# Patient Record
Sex: Female | Born: 1980 | Race: Asian | Hispanic: No | Marital: Married | Smoking: Never smoker
Health system: Southern US, Community
[De-identification: ages and names within clinical notes are randomized; demographics above are authoritative.]

## PROBLEM LIST (undated history)

## (undated) DIAGNOSIS — IMO0002 Reserved for concepts with insufficient information to code with codable children: Secondary | ICD-10-CM

## (undated) DIAGNOSIS — J45909 Unspecified asthma, uncomplicated: Secondary | ICD-10-CM

## (undated) DIAGNOSIS — M329 Systemic lupus erythematosus, unspecified: Secondary | ICD-10-CM

## (undated) HISTORY — PX: EYE SURGERY: SHX253

---

## 2018-03-27 ENCOUNTER — Ambulatory Visit (INDEPENDENT_AMBULATORY_CARE_PROVIDER_SITE_OTHER): Payer: BLUE CROSS/BLUE SHIELD

## 2018-03-27 ENCOUNTER — Ambulatory Visit
Admission: EM | Admit: 2018-03-27 | Discharge: 2018-03-27 | Disposition: A | Discharge status: Home / Self Care | Payer: BLUE CROSS/BLUE SHIELD

## 2018-03-27 ENCOUNTER — Encounter: Payer: Self-pay | Admitting: Gynecology

## 2018-03-27 ENCOUNTER — Other Ambulatory Visit: Payer: Self-pay

## 2018-03-27 DIAGNOSIS — R109 Unspecified abdominal pain: Secondary | ICD-10-CM

## 2018-03-27 DIAGNOSIS — R112 Nausea with vomiting, unspecified: Secondary | ICD-10-CM

## 2018-03-27 DIAGNOSIS — R197 Diarrhea, unspecified: Secondary | ICD-10-CM | POA: Diagnosis not present

## 2018-03-27 DIAGNOSIS — I862 Pelvic varices: Secondary | ICD-10-CM

## 2018-03-27 HISTORY — DX: Reserved for concepts with insufficient information to code with codable children: IMO0002

## 2018-03-27 HISTORY — DX: Unspecified asthma, uncomplicated: J45.909

## 2018-03-27 HISTORY — DX: Systemic lupus erythematosus, unspecified: M32.9

## 2018-03-27 LAB — CBC WITH DIFFERENTIAL/PLATELET
Abs Immature Granulocytes: 0.02 10*3/uL (ref 0.00–0.07)
Basophils Absolute: 0 10*3/uL (ref 0.0–0.1)
Basophils Relative: 0 %
Eosinophils Absolute: 0 10*3/uL (ref 0.0–0.5)
Eosinophils Relative: 0 %
HCT: 41.4 % (ref 36.0–46.0)
Hemoglobin: 14.2 g/dL (ref 12.0–15.0)
Immature Granulocytes: 0 %
Lymphocytes Relative: 15 %
Lymphs Abs: 0.8 10*3/uL (ref 0.7–4.0)
MCH: 30.7 pg (ref 26.0–34.0)
MCHC: 34.3 g/dL (ref 30.0–36.0)
MCV: 89.6 fL (ref 80.0–100.0)
Monocytes Absolute: 0.3 10*3/uL (ref 0.1–1.0)
Monocytes Relative: 6 %
Neutro Abs: 4.3 10*3/uL (ref 1.7–7.7)
Neutrophils Relative %: 79 %
Platelets: 207 10*3/uL (ref 150–400)
RBC: 4.62 MIL/uL (ref 3.87–5.11)
RDW: 11.8 % (ref 11.5–15.5)
WBC: 5.5 10*3/uL (ref 4.0–10.5)
nRBC: 0 % (ref 0.0–0.2)

## 2018-03-27 LAB — URINALYSIS, COMPLETE (UACMP) WITH MICROSCOPIC
Glucose, UA: NEGATIVE mg/dL
Ketones, ur: >160 mg/dL — AB
Leukocytes, UA: NEGATIVE
Nitrite: NEGATIVE
Specific Gravity, Urine: 1.025 (ref 1.005–1.030)
pH: 5.5 (ref 5.0–8.0)

## 2018-03-27 LAB — COMPREHENSIVE METABOLIC PANEL
ALT: 15 U/L (ref 0–44)
AST: 17 U/L (ref 15–41)
Albumin: 4.4 g/dL (ref 3.5–5.0)
Alkaline Phosphatase: 39 U/L (ref 38–126)
Anion gap: 9 (ref 5–15)
BUN: 11 mg/dL (ref 6–20)
CO2: 26 mmol/L (ref 22–32)
Calcium: 8.4 mg/dL — ABNORMAL LOW (ref 8.9–10.3)
Chloride: 102 mmol/L (ref 98–111)
Creatinine, Ser: 0.66 mg/dL (ref 0.44–1.00)
GFR calc Af Amer: >60 mL/min (ref >60)
GFR calc non Af Amer: >60 mL/min (ref >60)
Glucose, Bld: 92 mg/dL (ref 70–99)
Potassium: 3.3 mmol/L — ABNORMAL LOW (ref 3.5–5.1)
Sodium: 137 mmol/L (ref 135–145)
Total Bilirubin: 1.1 mg/dL (ref 0.3–1.2)
Total Protein: 7.5 g/dL (ref 6.5–8.1)

## 2018-03-27 LAB — PREGNANCY, URINE: Preg Test, Ur: NEGATIVE

## 2018-03-27 MED ORDER — ONDANSETRON 8 MG PO TBDP: 8.0000 mg | ORAL_TABLET | Freq: Two times a day (BID) | ORAL | 0 refills | Status: AC

## 2018-03-27 MED ORDER — IOHEXOL 300 MG/ML  SOLN
100.0000 mL | Freq: Once | INTRAMUSCULAR | Status: AC | PRN
  Administered 2018-03-27: 100 mL via INTRAVENOUS

## 2018-03-27 MED ORDER — ONDANSETRON 4 MG PO TBDP
4.0000 mg | ORAL_TABLET | Freq: Once | ORAL | Status: AC
  Administered 2018-03-27: 4 mg via ORAL

## 2018-03-27 NOTE — Discharge Instructions (Signed)
Drink plenty of fluids.  Advance your diet as tolerated starting with a BRAT choice

## 2018-03-27 NOTE — ED Triage Notes (Signed)
Per patient with nausea / vomiting and diarrhea x yesterday. Per patient taken Zofran x today 2 hrs ago. Per patient with abdominal pain

## 2018-03-27 NOTE — ED Provider Notes (Signed)
[facility]    CSN: [id number] Arrival date & time: 03/27/18  1321     History   Chief Complaint No chief complaint on file.   HPI Angelica Gomez is a 38 y.o. female.   HPI  38 year old female presents with approximate 36 hours of nausea vomiting and diarrhea.  She states that she awoke around 3:00 in the morning with severe vomiting.  The diarrhea has been very watery but has had no blood or mucus that she has recognized.  He has diffuse abdominal pain.  She has had no fever or chills.  She took a dose of Zofran 4 mg that she had leftover from previous illness seemed to help for a while.  Repeated later on the day did not get the same relief.  He ate outside the home chicken wings and coleslaw but her husband who is with her the same identical food without any ill effects.  [place].  Pain on returning today but did not want to drive 5 hours with her in this condition.       Past Medical History:  Diagnosis Date  . Asthma   . Lupus (HCC)     There are no active problems to display for this patient.   Past Surgical History:  Procedure Laterality Date  . EYE SURGERY      OB History   No obstetric history on file.      Home Medications    Prior to Admission medications   Medication Sig Start Date End Date Taking? Authorizing Provider  albuterol (PROVENTIL HFA;VENTOLIN HFA) 108 (90 Base) MCG/ACT inhaler Inhale into the lungs every 6 (six) hours as needed for wheezing or shortness of breath.   Yes [provider]  norethindrone-ethinyl estradiol-iron (ESTROSTEP FE,TILIA FE,TRI-LEGEST FE) 1-20/1-30/1-35 MG-MCG tablet Take 1 tablet by mouth daily.   Yes [provider]  ondansetron (ZOFRAN ODT) 8 MG disintegrating tablet Take 1 tablet (8 mg total) by mouth 2 (two) times daily. 03/27/18   Lutricia Feiloemer, Adrik Khim P, PA-C    Family History Family History  Problem Relation Age of Onset  . Stroke Mother   . Hypertension Father     . Stroke Father   . Thyroid disease Father     Social History Social History   Tobacco Use  . Smoking status: Never Smoker  . Smokeless tobacco: Never Used  Substance Use Topics  . Alcohol use: Yes    Frequency: Never  . Drug use: Never     Allergies   Plaquenil [hydroxychloroquine sulfate] and Sulfa antibiotics   Review of Systems Review of Systems  Constitutional: Positive for activity change and appetite change. Negative for chills, fatigue and fever.  Gastrointestinal: Positive for abdominal pain, diarrhea, nausea and vomiting.  All other systems reviewed and are negative.    Physical Exam Triage Vital Signs ED Triage Vitals  Enc Vitals Group     BP 03/27/18 1420 110/81     Pulse Rate 03/27/18 1420 (!) 109     Resp 03/27/18 1420 16     Temp 03/27/18 1420 98.8 F (37.1 C)     Temp Source 03/27/18 1420 Oral     SpO2 03/27/18 1420 100 %     Weight 03/27/18 1423 120 lb (54.4 kg)     Height 03/27/18 1423 5\' 3"  (1.6 m)     Head Circumference --      Peak Flow --      Pain Score 03/27/18 1423 8  Pain Loc --      Pain Edu? --      Excl. in GC? --    No data found.  Updated Vital Signs BP 110/81 (BP Location: Left Arm)   Pulse (!) 109   Temp 98.8 F (37.1 C) (Oral)   Resp 16   Ht 5\' 3"  (1.6 m)   Wt 120 lb (54.4 kg)   LMP 03/15/2018   SpO2 100%   BMI 21.26 kg/m   Visual Acuity Right Eye Distance:   Left Eye Distance:   Bilateral Distance:    Right Eye Near:   Left Eye Near:    Bilateral Near:     Physical Exam Vitals signs and nursing note reviewed.  Constitutional:      General: She is not in acute distress.    Appearance: Normal appearance. She is normal weight. She is not ill-appearing, toxic-appearing or diaphoretic.  HENT:     Head: Normocephalic.     Nose: Nose normal.     Mouth/Throat:     Mouth: Mucous membranes are moist.  Eyes:     General:        Right eye: No discharge.        Left eye: No discharge.      Conjunctiva/sclera: Conjunctivae normal.  Neck:     Musculoskeletal: Normal range of motion and neck supple.  Abdominal:     General: Bowel sounds are normal. There is no distension.     Palpations: Abdomen is soft.     Tenderness: There is abdominal tenderness. There is guarding. There is no right CVA tenderness, left CVA tenderness or rebound.     Comments: The patient has diffuse tenderness but has guarding with palpation.  This is most prevalent in the right and left lower quadrants.  There is no rebound.  No distention.  Musculoskeletal: Normal range of motion.  Skin:    General: Skin is warm and dry.  Neurological:     General: No focal deficit present.     Mental Status: She is alert and oriented to person, place, and time.  Psychiatric:        Mood and Affect: Mood normal.        Behavior: Behavior normal.        Thought Content: Thought content normal.        Judgment: Judgment normal.      UC Treatments / Results  Labs (all labs ordered are listed, but only abnormal results are displayed) Labs Reviewed  COMPREHENSIVE METABOLIC PANEL - Abnormal; Notable for the following components:      Result Value   Potassium 3.3 (*)    Calcium 8.4 (*)    All other components within normal limits  URINALYSIS, COMPLETE (UACMP) WITH MICROSCOPIC - Abnormal; Notable for the following components:   Color, Urine AMBER (*)    APPearance HAZY (*)    Hgb urine dipstick TRACE (*)    Bilirubin Urine SMALL (*)    Ketones, ur >160 (*)    Protein, ur TRACE (*)    Bacteria, UA RARE (*)    All other components within normal limits  CBC WITH DIFFERENTIAL/PLATELET  PREGNANCY, URINE    EKG None  Radiology Ct Abdomen Pelvis W Contrast  Result Date: 03/27/2018 CLINICAL DATA:  Acute onset of nausea with vomiting and generalized abdominal pain. No acute injury or prior relevant surgery. EXAM: CT ABDOMEN AND PELVIS WITH CONTRAST TECHNIQUE: Multidetector CT imaging of the abdomen and pelvis was  performed  using the standard protocol following bolus administration of intravenous contrast. CONTRAST:  OMNIPAQUE IOHEXOL 300 MG/ML  SOLN COMPARISON:  None. FINDINGS: Lower chest: Clear lung bases. No significant pleural or pericardial effusion. Hepatobiliary: The liver is normal in density without suspicious focal abnormality. No evidence of gallstones, gallbladder wall thickening or biliary dilatation. Pancreas: Unremarkable. No pancreatic ductal dilatation or surrounding inflammatory changes. Spleen: Normal in size without focal abnormality. Adrenals/Urinary Tract: Both adrenal glands appear normal. The kidneys appear normal without evidence of urinary tract calculus, suspicious lesion or hydronephrosis. No bladder abnormalities are seen. Stomach/Bowel: No evidence of bowel wall thickening, distention or surrounding inflammatory change. The appendix appears normal. Vascular/Lymphatic: There are no enlarged abdominal or pelvic lymph nodes. No significant vascular findings. Reproductive: The uterus appears normal. There are small ovarian follicles bilaterally. No suspicious adnexal findings. Small gonadal vein varices, left greater than right. Other: Small amount of free pelvic fluid, within physiologic limits. The anterior abdominal wall appears normal. Musculoskeletal: No acute or significant osseous findings. IMPRESSION: 1. No acute findings or explanation for the patient's symptoms. The appendix appears normal. 2. Small gonadal vein varices bilaterally. Electronically Signed   By: Carey Bullocks M.D.   On: 03/27/2018 16:57    Procedures Procedures (including critical care time)  Medications Ordered in UC Medications  ondansetron (ZOFRAN-ODT) disintegrating tablet 4 mg (4 mg Oral Given 03/27/18 1433)  iohexol (OMNIPAQUE) 300 MG/ML solution 100 mL (100 mLs Intravenous Contrast Given 03/27/18 1608)    Initial Impression / Assessment and Plan / UC Course  I have reviewed the triage vital signs  and the nursing notes.  Pertinent labs & imaging results that were available during my care of the patient were reviewed by me and considered in my medical decision making (see chart for details).   I reviewed the laboratory and CAT scan with the patient.  No alarming findings on laboratory CAT scan or physical exam other than the guarding she was exhibiting.  Was been able to tolerate fluids without difficulty.  I will send her home at this time for rehydration and prescribed  Zofran to assist in the nausea.  If she worsens she should go to the emergency department.  Weston Settle I think that she will be able to return to [place] tomorrow and return to work hopefully by Tuesday.   Final Clinical Impressions(s) / UC Diagnoses   Final diagnoses:  Nausea vomiting and diarrhea     Discharge Instructions     Drink plenty of fluids.  Advance your diet as tolerated starting with a BRAT choice    ED Prescriptions    Medication Sig Dispense Auth. Provider   ondansetron (ZOFRAN ODT) 8 MG disintegrating tablet Take 1 tablet (8 mg total) by mouth 2 (two) times daily. 6 tablet Lutricia Feil, PA-C     Controlled Substance Prescriptions [place] Controlled Substance Registry consulted? Not Applicable   Lutricia Feil, PA-C 03/27/18 1806

## 2018-03-28 ENCOUNTER — Telehealth: Payer: Self-pay | Admitting: Emergency Medicine

## 2018-03-28 NOTE — Telephone Encounter (Signed)
Called [employer], [employer] and [employer].  According to all spoken to this procedure does not require a prior authorization

## 2018-03-29 ENCOUNTER — Telehealth: Payer: Self-pay | Admitting: Emergency Medicine

## 2018-03-29 NOTE — Telephone Encounter (Signed)
[place] from precert center called stating [facility] did require a precert in this case.  Pre certification was obtainied on conference call with [place] and Leeanne [place]. From [place].  Reference number is [id number]

## 2019-08-08 IMAGING — CT CT ABD-PELV W/ CM
1 of 2 series · 15 of 32 positions shown, 19 images · IV contrast (APPLIED)
Comparison: None.

CLINICAL DATA: Acute onset of nausea with vomiting and generalized
abdominal pain. No acute injury or prior relevant surgery.

EXAM:
CT ABDOMEN AND PELVIS WITH CONTRAST
TECHNIQUE: Multidetector CT imaging of the abdomen and pelvis was performed
using the standard protocol following bolus administration of
intravenous contrast.
CONTRAST:  100mL OMNIPAQUE IOHEXOL 300 MG/ML  SOLN

[Series 2: axial st · axial · 0.61mm/px · z∈[-809,-409]mm · 15 of 88 slices shown, 19 images]
[im 4/88  soft-tissue]
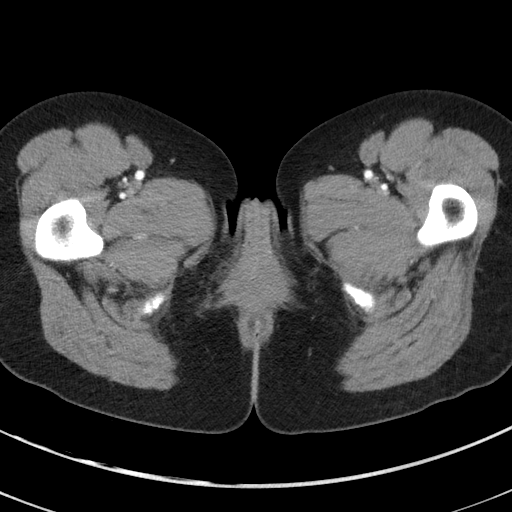
[im 4/88  bone]
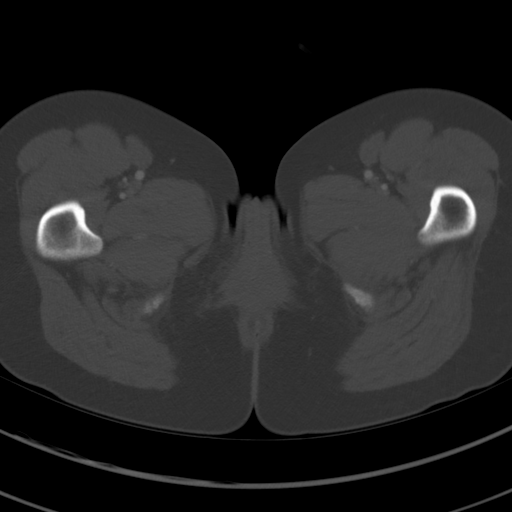
[im 11/88  soft-tissue]
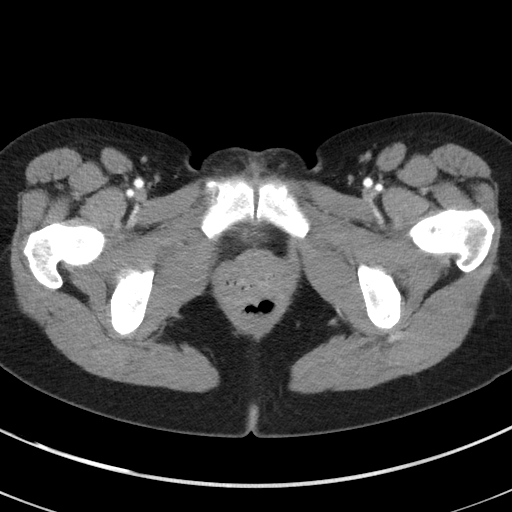
[im 18/88  soft-tissue]
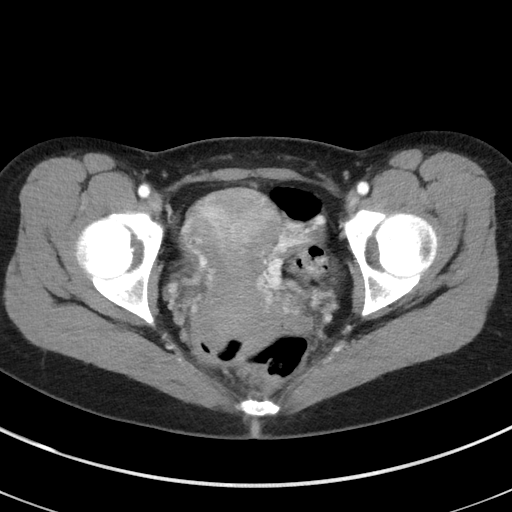
[im 25/88  soft-tissue]
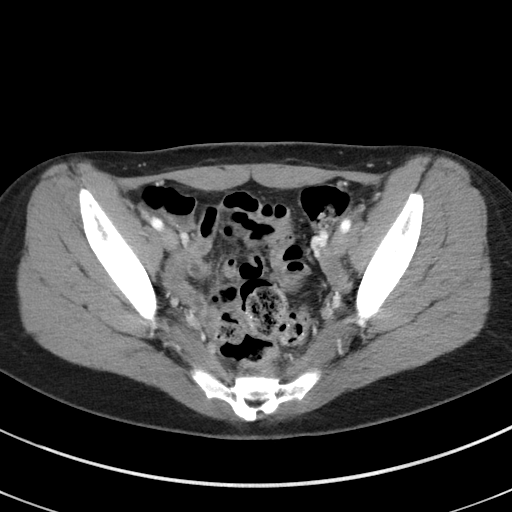
[im 32/88  soft-tissue]
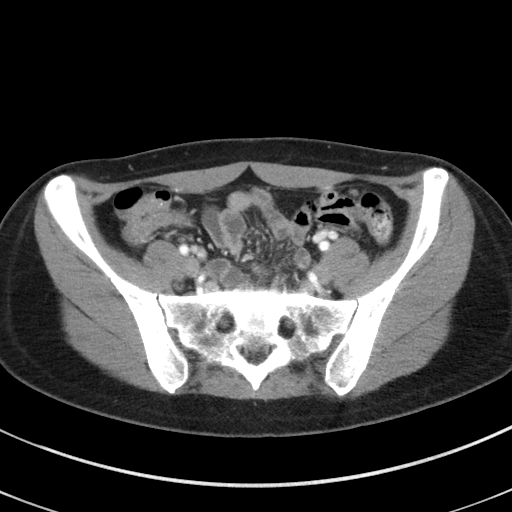
[im 39/88  soft-tissue]
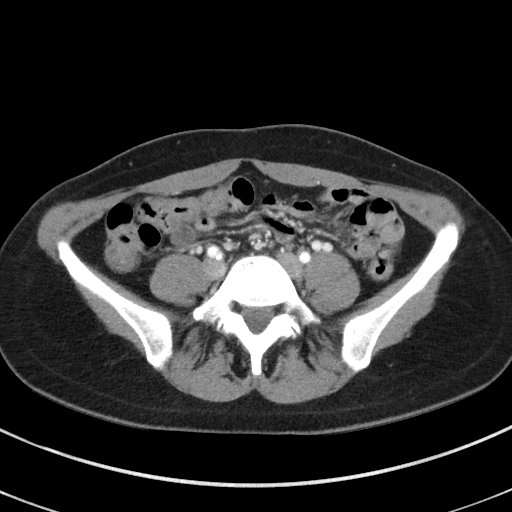
[im 46/88  soft-tissue]
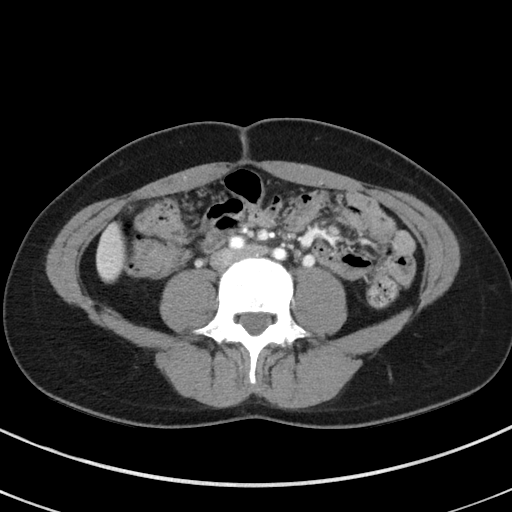
[im 49/88  soft-tissue]
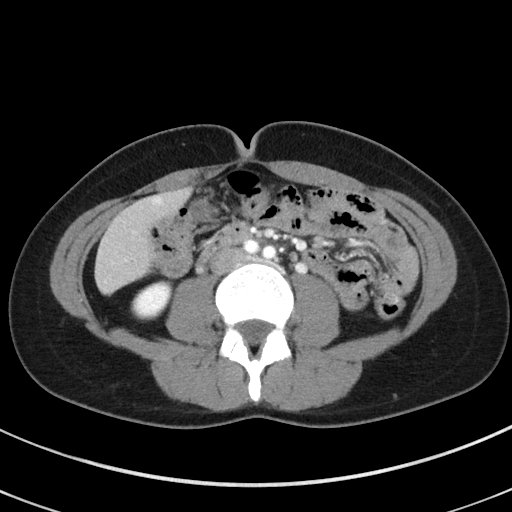
[im 56/88  soft-tissue]
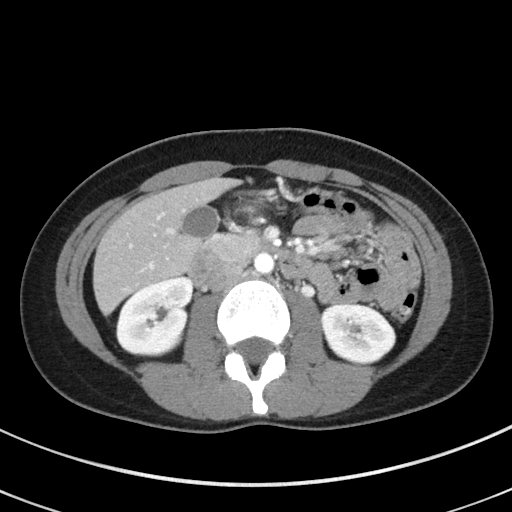
[im 56/88  bone]
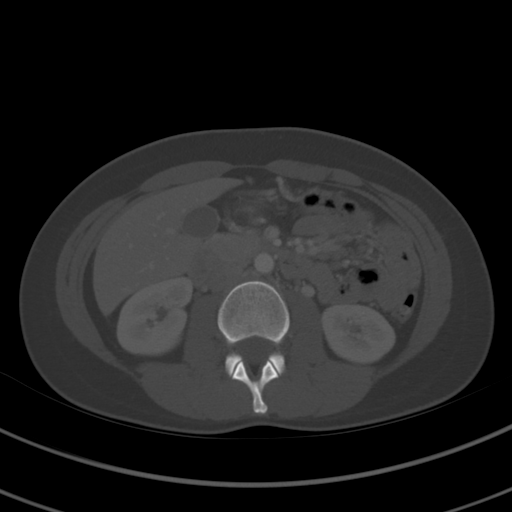
[im 63/88  soft-tissue]
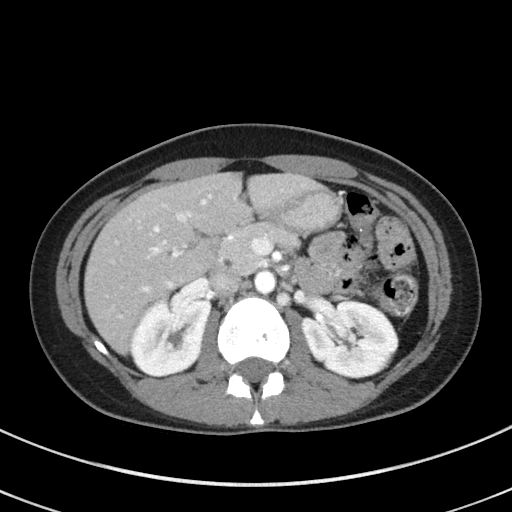
[im 70/88  soft-tissue]
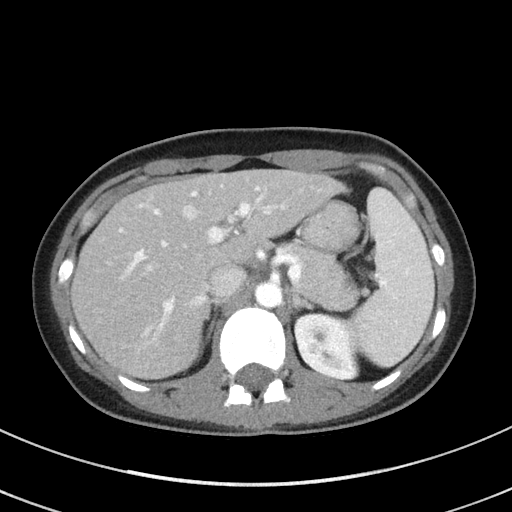
[im 74/88  lung]
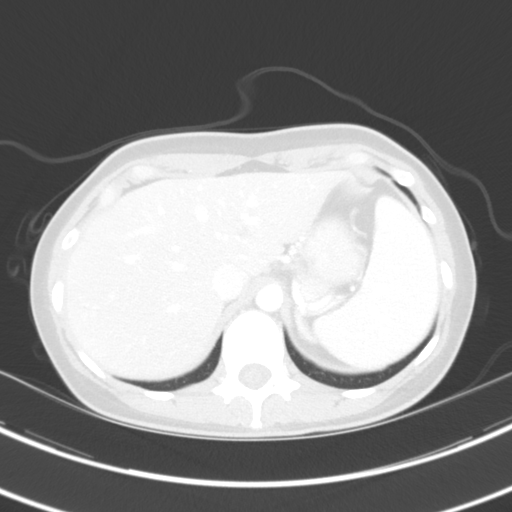
[im 77/88  soft-tissue]
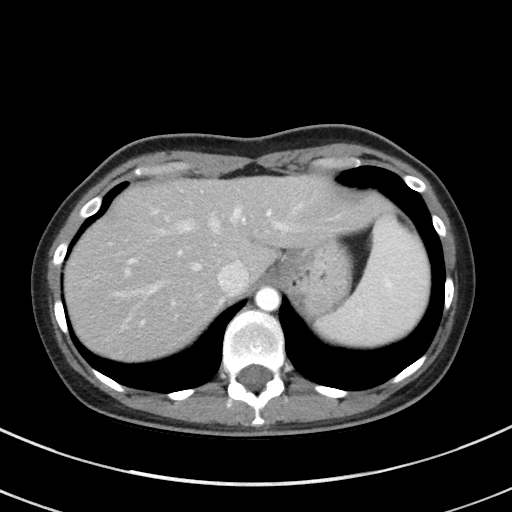
[im 77/88  lung]
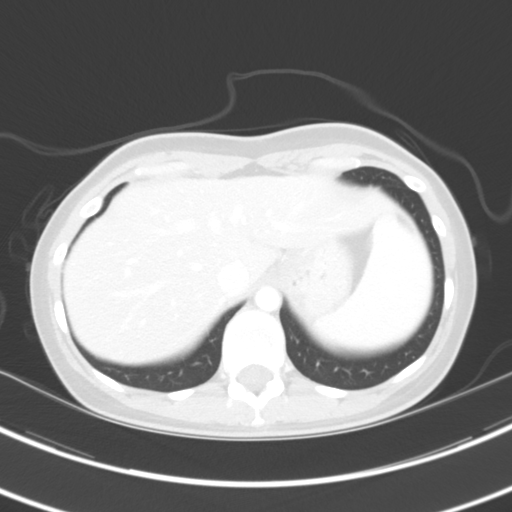
[im 81/88  lung]
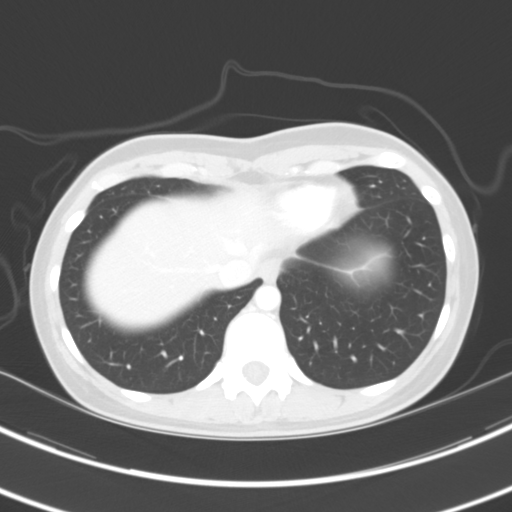
[im 84/88  soft-tissue]
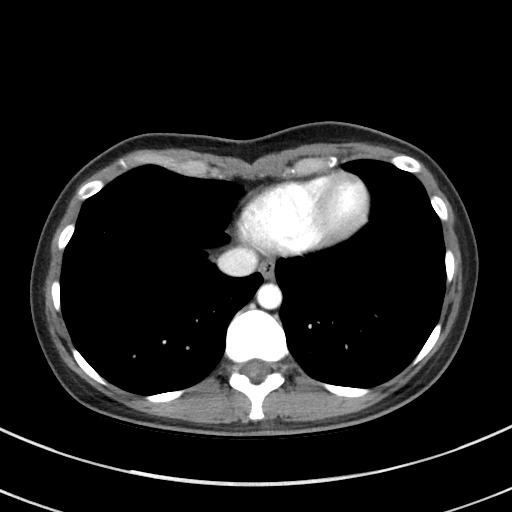
[im 84/88  lung]
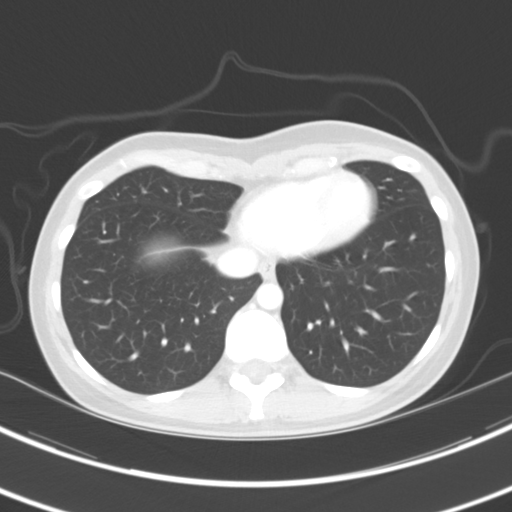

[15 of 32 positions shown; findings below may reference images not displayed]

FINDINGS: Lower chest: Clear lung bases. No significant pleural or pericardial
effusion.

Hepatobiliary: The liver is normal in density without suspicious
focal abnormality. No evidence of gallstones, gallbladder wall
thickening or biliary dilatation.

Pancreas: Unremarkable. No pancreatic ductal dilatation or
surrounding inflammatory changes.

Spleen: Normal in size without focal abnormality.

Adrenals/Urinary Tract: Both adrenal glands appear normal. The
kidneys appear normal without evidence of urinary tract calculus,
suspicious lesion or hydronephrosis. No bladder abnormalities are
seen.

Stomach/Bowel: No evidence of bowel wall thickening, distention or
surrounding inflammatory change. The appendix appears normal.

Vascular/Lymphatic: There are no enlarged abdominal or pelvic lymph
nodes. No significant vascular findings.

Reproductive: The uterus appears normal. There are small ovarian
follicles bilaterally. No suspicious adnexal findings. Small gonadal
vein varices, left greater than right.

Other: Small amount of free pelvic fluid, within physiologic limits.
The anterior abdominal wall appears normal.

Musculoskeletal: No acute or significant osseous findings.
IMPRESSION: 1. No acute findings or explanation for the patient's symptoms. The
appendix appears normal.
2. Small gonadal vein varices bilaterally.
